# Patient Record
Sex: Male | Born: 1975 | Race: White | Hispanic: No | Marital: Single
Health system: Southern US, Community
[De-identification: ages and names within clinical notes are randomized; demographics above are authoritative.]

---

## 2015-05-08 ENCOUNTER — Emergency Department (HOSPITAL_COMMUNITY): Payer: Managed Care, Other (non HMO)

## 2015-05-08 ENCOUNTER — Encounter (HOSPITAL_COMMUNITY): Payer: Self-pay | Admitting: Emergency Medicine

## 2015-05-08 ENCOUNTER — Emergency Department (HOSPITAL_COMMUNITY)
Admission: EM | Admit: 2015-05-08 | Discharge: 2015-05-08 | Disposition: A | Discharge status: Home / Self Care | Payer: Managed Care, Other (non HMO) | Attending: Emergency Medicine | Admitting: Emergency Medicine

## 2015-05-08 DIAGNOSIS — K449 Diaphragmatic hernia without obstruction or gangrene: Secondary | ICD-10-CM | POA: Diagnosis not present

## 2015-05-08 DIAGNOSIS — Z79899 Other long term (current) drug therapy: Secondary | ICD-10-CM | POA: Diagnosis not present

## 2015-05-08 DIAGNOSIS — K529 Noninfective gastroenteritis and colitis, unspecified: Secondary | ICD-10-CM

## 2015-05-08 DIAGNOSIS — K6389 Other specified diseases of intestine: Secondary | ICD-10-CM

## 2015-05-08 DIAGNOSIS — R1013 Epigastric pain: Secondary | ICD-10-CM | POA: Diagnosis present

## 2015-05-08 DIAGNOSIS — K36 Other appendicitis: Secondary | ICD-10-CM | POA: Insufficient documentation

## 2015-05-08 LAB — COMPREHENSIVE METABOLIC PANEL
ALBUMIN: 3.5 g/dL (ref 3.5–5.0)
ALK PHOS: 64 U/L (ref 38–126)
ALT: 21 U/L (ref 17–63)
ANION GAP: 9 (ref 5–15)
AST: 21 U/L (ref 15–41)
BILIRUBIN TOTAL: 1.2 mg/dL (ref 0.3–1.2)
BUN: 13 mg/dL (ref 6–20)
CALCIUM: 9 mg/dL (ref 8.9–10.3)
CO2: 26 mmol/L (ref 22–32)
CREATININE: 1.04 mg/dL (ref 0.61–1.24)
Chloride: 100 mmol/L — ABNORMAL LOW (ref 101–111)
GFR calc Af Amer: >60 mL/min (ref >60)
GFR calc non Af Amer: >60 mL/min (ref >60)
GLUCOSE: 106 mg/dL — AB (ref 65–99)
Potassium: 4.1 mmol/L (ref 3.5–5.1)
Sodium: 135 mmol/L (ref 135–145)
TOTAL PROTEIN: 6.9 g/dL (ref 6.5–8.1)

## 2015-05-08 LAB — URINALYSIS, ROUTINE W REFLEX MICROSCOPIC
GLUCOSE, UA: NEGATIVE mg/dL
Ketones, ur: 15 mg/dL — AB
Nitrite: NEGATIVE
PH: 6 (ref 5.0–8.0)
Protein, ur: >300 mg/dL — AB
SPECIFIC GRAVITY, URINE: 1.022 (ref 1.005–1.030)
UROBILINOGEN UA: 0.2 mg/dL (ref 0.0–1.0)

## 2015-05-08 LAB — CBC WITH DIFFERENTIAL/PLATELET
BASOS PCT: 0 %
Basophils Absolute: 0 10*3/uL (ref 0.0–0.1)
Eosinophils Absolute: 0 10*3/uL (ref 0.0–0.7)
Eosinophils Relative: 0 %
HEMATOCRIT: 41.1 % (ref 39.0–52.0)
HEMOGLOBIN: 13.9 g/dL (ref 13.0–17.0)
LYMPHS ABS: 1.9 10*3/uL (ref 0.7–4.0)
Lymphocytes Relative: 13 %
MCH: 29.7 pg (ref 26.0–34.0)
MCHC: 33.8 g/dL (ref 30.0–36.0)
MCV: 87.8 fL (ref 78.0–100.0)
MONOS PCT: 11 %
Monocytes Absolute: 1.6 10*3/uL — ABNORMAL HIGH (ref 0.1–1.0)
NEUTROS ABS: 11.2 10*3/uL — AB (ref 1.7–7.7)
NEUTROS PCT: 76 %
Platelets: 263 10*3/uL (ref 150–400)
RBC: 4.68 MIL/uL (ref 4.22–5.81)
RDW: 13.9 % (ref 11.5–15.5)
WBC: 14.8 10*3/uL — ABNORMAL HIGH (ref 4.0–10.5)

## 2015-05-08 LAB — URINE MICROSCOPIC-ADD ON

## 2015-05-08 LAB — LIPASE, BLOOD: Lipase: 33 U/L (ref 22–51)

## 2015-05-08 MED ORDER — OXYCODONE-ACETAMINOPHEN 5-325 MG PO TABS: 1.0000 | ORAL_TABLET | Freq: Three times a day (TID) | ORAL | Status: AC | PRN

## 2015-05-08 MED ORDER — SODIUM CHLORIDE 0.9 % IV BOLUS (SEPSIS)
1000.0000 mL | Freq: Once | INTRAVENOUS | Status: AC
  Administered 2015-05-08: 1000 mL via INTRAVENOUS

## 2015-05-08 MED ORDER — ONDANSETRON HCL 4 MG PO TABS: 4.0000 mg | ORAL_TABLET | Freq: Three times a day (TID) | ORAL | Status: AC | PRN

## 2015-05-08 MED ORDER — ONDANSETRON HCL 4 MG/2ML IJ SOLN
4.0000 mg | Freq: Once | INTRAMUSCULAR | Status: AC
  Administered 2015-05-08: 4 mg via INTRAVENOUS
  Filled 2015-05-08: qty 2

## 2015-05-08 MED ORDER — IOHEXOL 300 MG/ML  SOLN
100.0000 mL | Freq: Once | INTRAMUSCULAR | Status: AC | PRN
  Administered 2015-05-08: 100 mL via INTRAVENOUS

## 2015-05-08 MED ORDER — MORPHINE SULFATE (PF) 4 MG/ML IV SOLN
6.0000 mg | Freq: Once | INTRAVENOUS | Status: AC
  Administered 2015-05-08: 6 mg via INTRAVENOUS
  Filled 2015-05-08: qty 2

## 2015-05-08 NOTE — ED Notes (Signed)
Pt. Stated, I got sick on my stomach on Thursday and its still hurting.

## 2015-05-08 NOTE — Discharge Instructions (Signed)
You have epiploic appendagitis. This is a torsion of a piece of fat off of your colon. It will subside in 3-14 days from onset. If you start having fevers, significant worsening of abdominal pain, no bowel movements for longer than 5 days, nausea vomiting not able to tolerate food or fluids then return to the emergency department immediately.

## 2015-05-08 NOTE — ED Provider Notes (Signed)
CSN: [id number]     Arrival date & time 05/08/15  1117 History   First MD Initiated Contact with Patient 05/08/15 1138     Chief Complaint  Patient presents with  . Abdominal Pain     (Consider location/radiation/quality/duration/timing/severity/associated sxs/prior Treatment) Patient is a 39 y.o. male presenting with abdominal pain.  Abdominal Pain Pain location:  Epigastric and LUQ Pain quality: aching and sharp   Pain radiates to:  Does not radiate Pain severity:  Mild Onset quality:  Gradual Duration:  5 days Timing:  Constant Progression:  Worsening Chronicity:  New Context: not alcohol use, not previous surgeries and not recent illness   Relieved by:  None tried Worsened by:  Nothing tried Ineffective treatments:  None tried Associated symptoms: no chest pain, no chills, no cough, no dysuria, no fatigue, no fever, no hematuria and no vomiting   Risk factors: not pregnant     History reviewed. No pertinent past medical history. History reviewed. No pertinent past surgical history. No family history on file. Social History  Substance Use Topics  . Smoking status: None  . Smokeless tobacco: None  . Alcohol Use: Yes    Review of Systems  Constitutional: Negative for fever, chills and fatigue.  Eyes: Negative for pain.  Respiratory: Negative for cough.   Cardiovascular: Negative for chest pain.  Gastrointestinal: Positive for abdominal pain. Negative for vomiting.  Genitourinary: Negative for dysuria and hematuria.  Musculoskeletal: Negative for back pain and gait problem.  All other systems reviewed and are negative.     Allergies  Review of patient's allergies indicates no known allergies.  Home Medications   Prior to Admission medications   Medication Sig Start Date End Date Taking? Authorizing Provider  Alum & Mag Hydroxide-Simeth (DI-GEL) 282-87-25 MG/5ML SUSP Take 30 mLs by mouth 4 (four) times daily - after meals and at bedtime.   Yes Historical  Provider, MD  HYDROcodone-acetaminophen (NORCO) 5-325 MG per tablet Take 1 tablet by mouth every 4 (four) hours as needed for moderate pain.   Yes Historical Provider, MD  omeprazole (PRILOSEC) 20 MG capsule Take 20 mg by mouth daily.   Yes Historical Provider, MD  ondansetron (ZOFRAN) 4 MG tablet Take 1 tablet (4 mg total) by mouth every 8 (eight) hours as needed for nausea or vomiting. 05/08/15   Marily Memos, MD  oxyCODONE-acetaminophen (PERCOCET/ROXICET) 5-325 MG per tablet Take 1-2 tablets by mouth every 8 (eight) hours as needed for severe pain. 05/08/15   Barbara Cower Mesner, MD   BP 132/81 mmHg  Pulse 80  Temp(Src) 98.7 F (37.1 C) (Oral)  Resp 18  Ht  (1.676 m)  Wt 179 lb (81.194 kg)  BMI 28.91 kg/m2  SpO2 96% Physical Exam  Constitutional: He is oriented to person, place, and time. He appears well-developed and well-nourished.  HENT:  Head: Normocephalic and atraumatic.  Eyes: Conjunctivae and EOM are normal.  Neck: Normal range of motion. Neck supple.  Cardiovascular: Normal rate and regular rhythm.   Pulmonary/Chest: Effort normal. No respiratory distress.  Abdominal: Soft. There is tenderness (epigastric and LUQ).  Musculoskeletal: Normal range of motion. He exhibits no edema or tenderness.  Neurological: He is alert and oriented to person, place, and time.  Skin: Skin is warm and dry.  Nursing note and vitals reviewed.   ED Course  Procedures (including critical care time) Labs Review Labs Reviewed  CBC WITH DIFFERENTIAL/PLATELET - Abnormal; Notable for the following:    WBC 14.8 (*)    Neutro  Abs 11.2 (*)    Monocytes Absolute 1.6 (*)    All other components within normal limits  COMPREHENSIVE METABOLIC PANEL - Abnormal; Notable for the following:    Chloride 100 (*)    Glucose, Bld 106 (*)    All other components within normal limits  URINALYSIS, ROUTINE W REFLEX MICROSCOPIC (NOT AT [facility]) - Abnormal; Notable for the following:    Color, Urine AMBER (*)     APPearance CLOUDY (*)    Hgb urine dipstick LARGE (*)    Bilirubin Urine SMALL (*)    Ketones, ur 15 (*)    Protein, ur >300 (*)    Leukocytes, UA TRACE (*)    All other components within normal limits  URINE MICROSCOPIC-ADD ON - Abnormal; Notable for the following:    Casts GRANULAR CAST (*)    All other components within normal limits  LIPASE, BLOOD    Imaging Review Ct Abdomen Pelvis W Contrast  05/08/2015   CLINICAL DATA:  Upper abdominal pain, constipation x4 days  EXAM: CT ABDOMEN AND PELVIS WITH CONTRAST  TECHNIQUE: Multidetector CT imaging of the abdomen and pelvis was performed using the standard protocol following bolus administration of intravenous contrast.  CONTRAST:  OMNIPAQUE IOHEXOL 300 MG/ML  SOLN  COMPARISON:  None.  FINDINGS: Lower chest: Mild patchy bilateral lower lobe opacities,, atelectasis versus pneumonia.  Hepatobiliary: Numerous subcentimeter hypodense lesions in the liver, possibly reflecting cysts, although too small to characterize.  Gallbladder is unremarkable. No intrahepatic or extrahepatic ductal dilatation.  Pancreas: Within normal limits.  Spleen: Within normal limits.  Adrenals/Urinary Tract: Adrenal glands are within normal limits.  Two left upper pole renal calculi measuring up to 5 mm (series 2/ image 31). Additional 2 mm nonobstructing left lower pole renal calculus (series 2/ image 40).  Two punctate nonobstructing interpolar right renal calculi (series 2/images 37 and 41).  No ureteral or bladder calculi.  No hydronephrosis.  Bladder is mildly thick-walled although underdistended.  Stomach/Bowel: Stomach is notable for a tiny hiatal hernia. Mild stranding along the anterior aspect of the greater curvature (series 2/ image 29), nonspecific, favored to be related to the mesenteric abnormality (described in the "other" section below).  No evidence of bowel obstruction.  Normal appendix (coronal image 56).  No colonic wall thickening or inflammatory  changes.  Vascular/Lymphatic: No evidence of abdominal aortic aneurysm.  No suspicious abdominopelvic lymphadenopathy.  Reproductive: Prostate is unremarkable.  Other: Small volume pelvic ascites (series 2/image 36).  Inflammatory changes centered in the anterior abdominal mesentery and adjacent to the mid transverse colon, with associated central fat density (series 2/ image 42), favored to reflect epiploic appendagitis or fat necrosis.  Musculoskeletal: Mild degenerative changes of the visualized thoracolumbar spine.  IMPRESSION: Suspected epiploic appendagitis involving the anterior transverse mesocolon. This is a benign, self limited condition which likely accounts for the patient's abdominal pain.  No evidence of bowel obstruction.  Normal appendix.  Bilateral nonobstructing renal calculi measuring up to 5 mm. No ureteral or bladder calculi. No hydronephrosis.  Numerous subcentimeter hypodense lesions in the liver, possibly reflecting hepatic cysts, technically indeterminate/too small to characterize.   Electronically Signed   By: Charline Bills M.D.   On: 05/08/2015 14:01   I have personally reviewed and evaluated these images and lab results as part of my medical decision-making.   EKG Interpretation None      MDM   Final diagnoses:  Epiploic appendagitis  Hiatal hernia    39 year old male with epigastric and  left upper quadrant abdominal pain. Started a few days ago on a cruise. No other symptoms. Last bowel movement 2 days ago. CT scan with evidence of epiploic appendagitis. Patient's pain control in the ED, no nausea, vomiting. Discharge instructions given, pain medicines given, will follow-up with doctor as needed.  I have personally and contemperaneously reviewed labs and imaging and used in my decision making as above.   A medical screening exam was performed and I feel the patient has had an appropriate workup for their chief complaint at this time and likelihood of emergent  condition existing is low. They have been counseled on decision, discharge, follow up and which symptoms necessitate immediate return to the emergency department. They or their family verbally stated understanding and agreement with plan and discharged in stable condition.     Marily Memos, MD 05/08/15 1536

## 2016-09-18 IMAGING — CT CT ABD-PELV W/ CM
2 of 5 series · 15 of 46 positions shown, 17 images · IV contrast (Omni 300)
Comparison: None.

CLINICAL DATA: Upper abdominal pain, constipation x4 days

EXAM:
CT ABDOMEN AND PELVIS WITH CONTRAST
TECHNIQUE: Multidetector CT imaging of the abdomen and pelvis was performed
using the standard protocol following bolus administration of
intravenous contrast.
CONTRAST:  100mL OMNIPAQUE IOHEXOL 300 MG/ML  SOLN

[Series 2: a/p w/o 5mm · axial · non-contrast · 0.73mm/px · z∈[+797,+1242]mm · 12 of 101 slices shown, 14 images]
[im 6/101  soft-tissue]
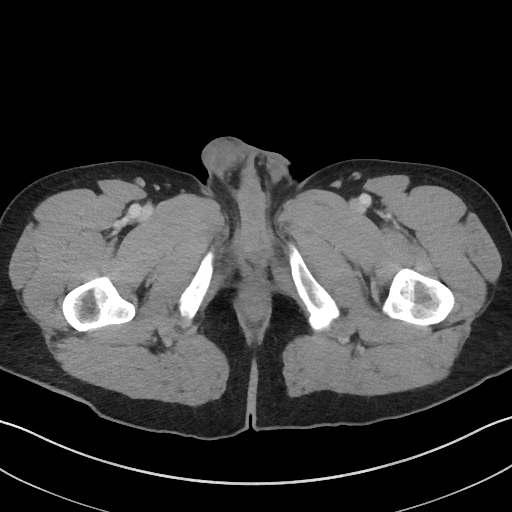
[im 6/101  bone]
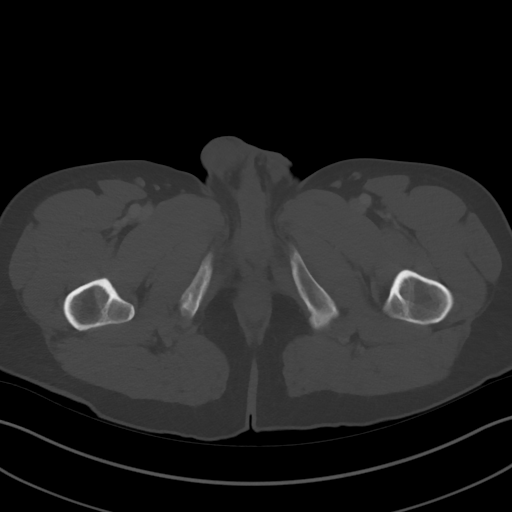
[im 16/101  soft-tissue]
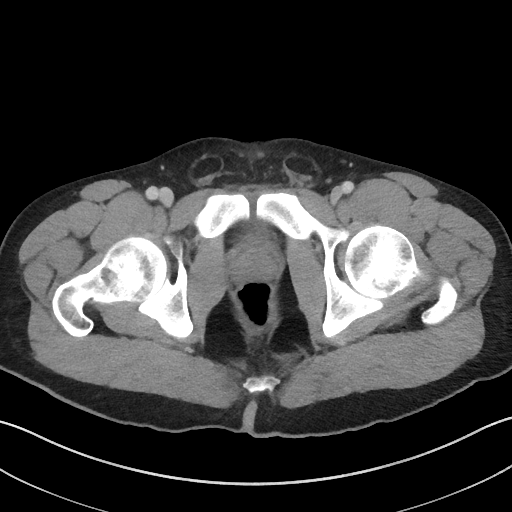
[im 22/101  soft-tissue]
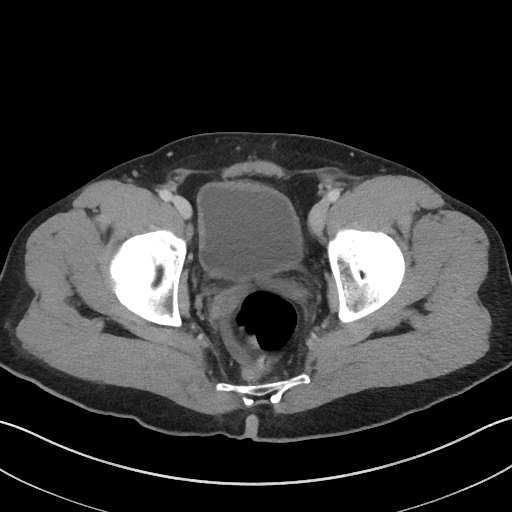
[im 32/101  soft-tissue]
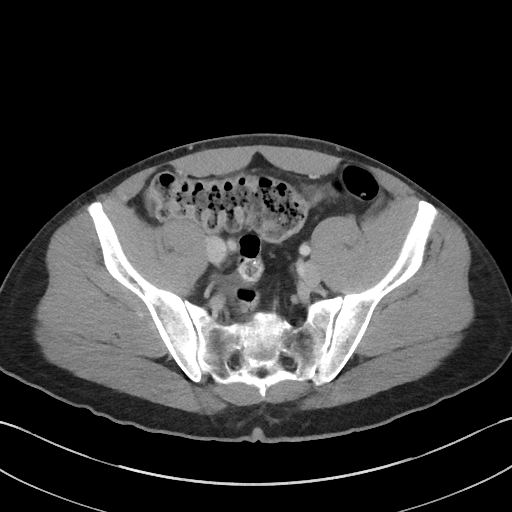
[im 37/101  soft-tissue]
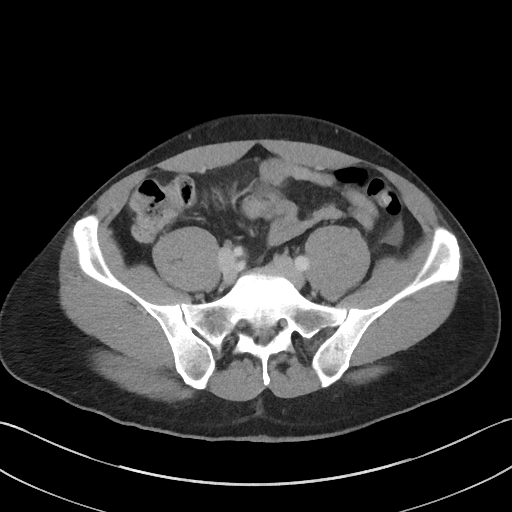
[im 48/101  soft-tissue]
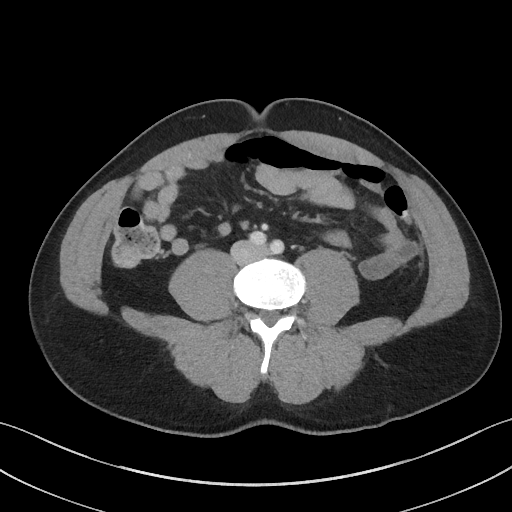
[im 53/101  soft-tissue]
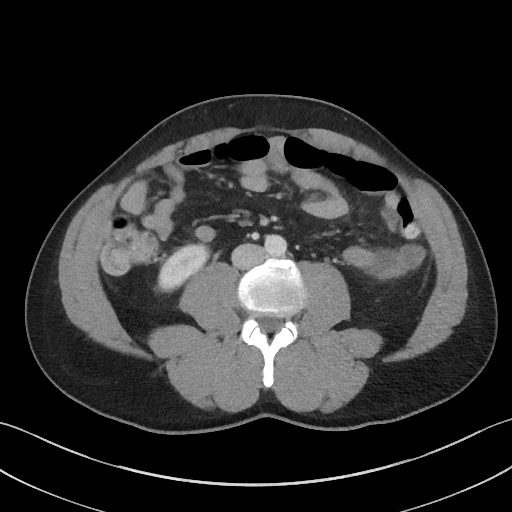
[im 64/101  soft-tissue]
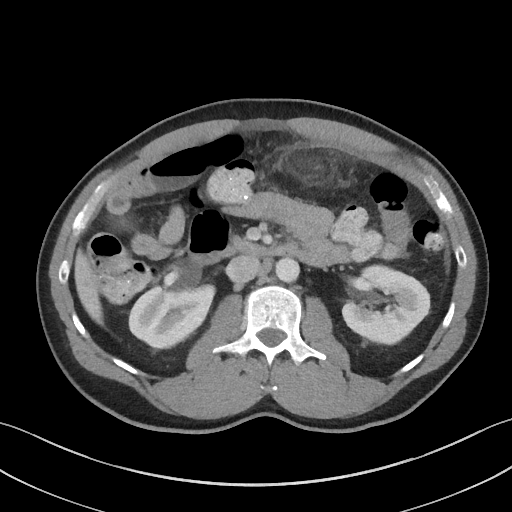
[im 69/101  soft-tissue]
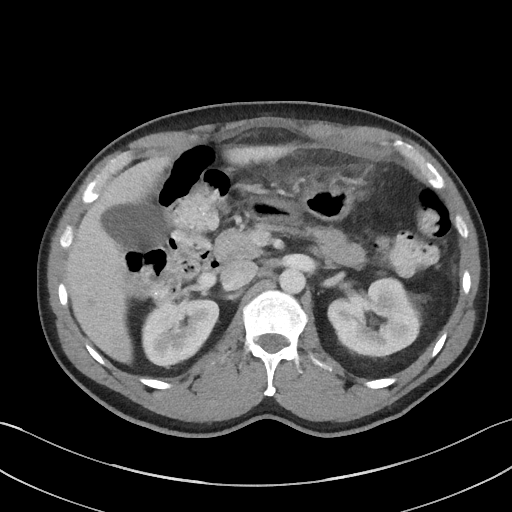
[im 69/101  bone]
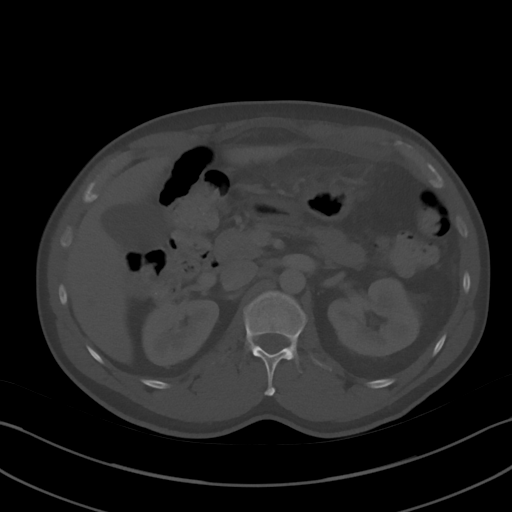
[im 79/101  soft-tissue]
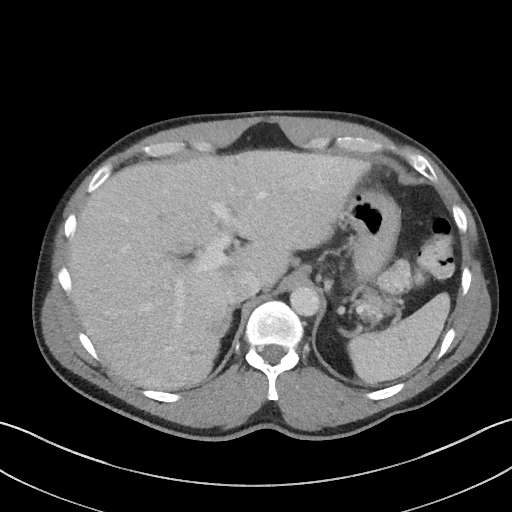
[im 85/101  soft-tissue]
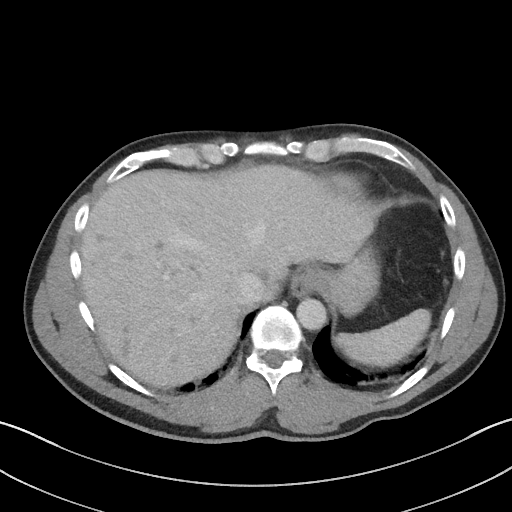
[im 95/101  soft-tissue]
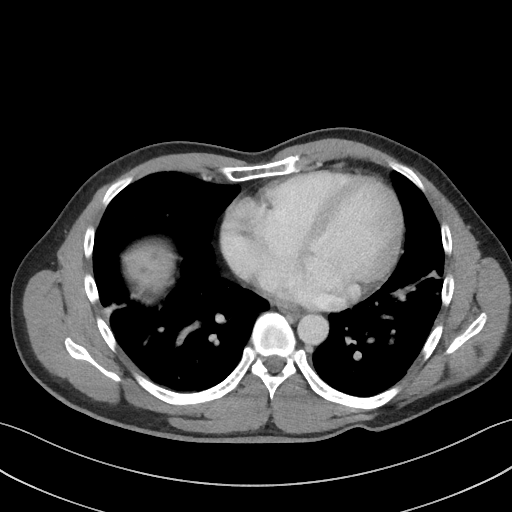

[Series 5: a/p w/o cor · coronal · non-contrast · 0.74mm/px · 3 of 129 slices shown]
[im 43/129  soft-tissue]
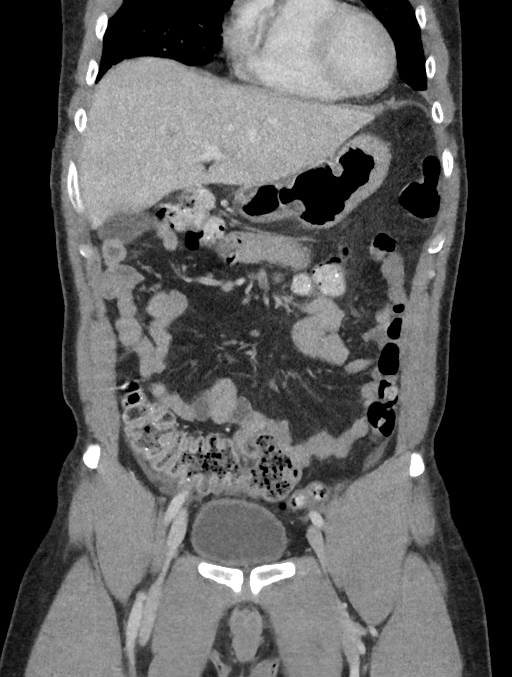
[im 57/129  soft-tissue]
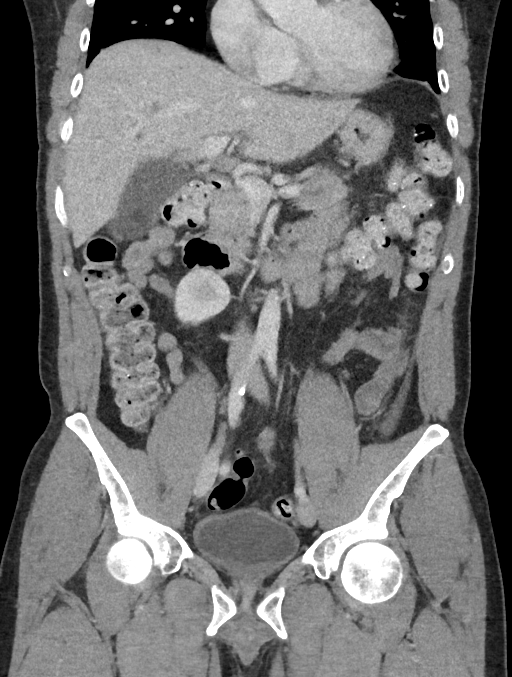
[im 72/129  soft-tissue]
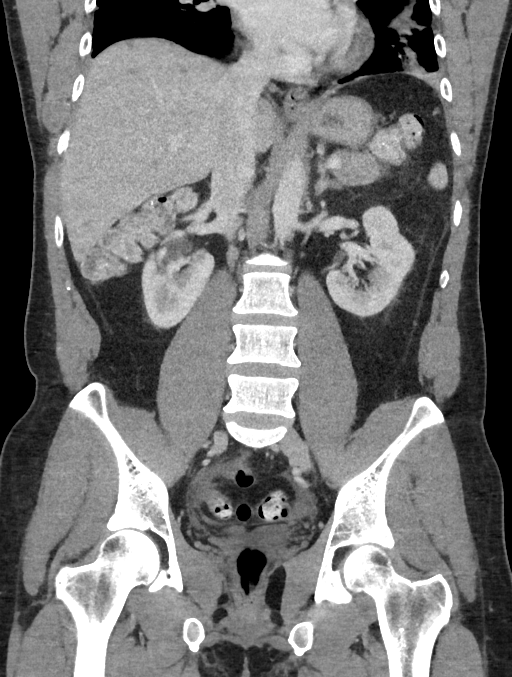

[15 of 46 positions shown; findings below may reference images not displayed]

FINDINGS: Lower chest: Mild patchy bilateral lower lobe opacities,,
atelectasis versus pneumonia.

Hepatobiliary: Numerous subcentimeter hypodense lesions in the
liver, possibly reflecting cysts, although too small to
characterize.

Gallbladder is unremarkable. No intrahepatic or extrahepatic ductal
dilatation.

Pancreas: Within normal limits.

Spleen: Within normal limits.

Adrenals/Urinary Tract: Adrenal glands are within normal limits.

Two left upper pole renal calculi measuring up to 5 mm (series 2/
image 31). Additional 2 mm nonobstructing left lower pole renal
calculus (series 2/ image 40).

Two punctate nonobstructing interpolar right renal calculi (series
2/images 37 and 41).

No ureteral or bladder calculi.  No hydronephrosis.

Bladder is mildly thick-walled although underdistended.

Stomach/Bowel: Stomach is notable for a tiny hiatal hernia. Mild
stranding along the anterior aspect of the greater curvature (series
2/ image 29), nonspecific, favored to be related to the mesenteric
abnormality (described in the "other" section below).

No evidence of bowel obstruction.

Normal appendix (coronal image 56).

No colonic wall thickening or inflammatory changes.

Vascular/Lymphatic: No evidence of abdominal aortic aneurysm.

No suspicious abdominopelvic lymphadenopathy.

Reproductive: Prostate is unremarkable.

Other: Small volume pelvic ascites (series 2/image 36).

Inflammatory changes centered in the anterior abdominal mesentery
and adjacent to the mid transverse colon, with associated central
fat density (series 2/ image 42), favored to reflect epiploic
appendagitis or fat necrosis.

Musculoskeletal: Mild degenerative changes of the visualized
thoracolumbar spine.
IMPRESSION: Suspected epiploic appendagitis involving the anterior transverse
mesocolon. This is a benign, self limited condition which likely
accounts for the patient's abdominal pain.

No evidence of bowel obstruction.  Normal appendix.

Bilateral nonobstructing renal calculi measuring up to 5 mm. No
ureteral or bladder calculi. No hydronephrosis.

Numerous subcentimeter hypodense lesions in the liver, possibly
reflecting hepatic cysts, technically indeterminate/too small to
characterize.
